# Patient Record
Sex: Male | Born: 1985 | Race: White | Hispanic: No | Marital: Married | Smoking: Never smoker
Health system: Southern US, Community
[De-identification: ages and names within clinical notes are randomized; demographics above are authoritative.]

## PROBLEM LIST (undated history)

## (undated) DIAGNOSIS — R109 Unspecified abdominal pain: Secondary | ICD-10-CM

## (undated) DIAGNOSIS — J45909 Unspecified asthma, uncomplicated: Secondary | ICD-10-CM

## (undated) DIAGNOSIS — M545 Low back pain, unspecified: Secondary | ICD-10-CM

## (undated) HISTORY — DX: Low back pain: M54.5

## (undated) HISTORY — DX: Unspecified abdominal pain: R10.9

## (undated) HISTORY — DX: Low back pain, unspecified: M54.50

## (undated) HISTORY — DX: Unspecified asthma, uncomplicated: J45.909

---

## 2006-04-03 ENCOUNTER — Emergency Department (HOSPITAL_COMMUNITY): Admission: EM | Admit: 2006-04-03 | Discharge: 2006-04-04 | Payer: Self-pay | Admitting: Emergency Medicine

## 2006-04-12 ENCOUNTER — Ambulatory Visit: Payer: Self-pay | Admitting: Internal Medicine

## 2006-04-15 ENCOUNTER — Ambulatory Visit: Payer: Self-pay | Admitting: Internal Medicine

## 2006-04-15 LAB — CONVERTED CEMR LAB
HCV Ab: NEGATIVE
Hemoglobin: 15.6 g/dL (ref 13.0–17.0)
Hep B S Ab: NEGATIVE
Hepatitis B Surface Ag: NEGATIVE
Lymphocytes Relative: 31.3 % (ref 12.0–46.0)
Mono Screen: NEGATIVE
Monocytes Relative: 10.1 % (ref 3.0–11.0)
Platelets: 262 10*3/uL (ref 150–400)
RBC: 5.08 M/uL (ref 4.22–5.81)
Sed Rate: 6 mm/hr (ref 0–20)
TSH: 1.85 microintl units/mL (ref 0.35–5.50)
Total CK: 131 units/L (ref 7–195)
Vitamin B-12: 396 pg/mL (ref 211–911)
WBC: 4.2 10*3/uL — ABNORMAL LOW (ref 4.5–10.5)

## 2006-04-19 ENCOUNTER — Ambulatory Visit: Payer: Self-pay | Admitting: Internal Medicine

## 2006-04-30 ENCOUNTER — Ambulatory Visit: Payer: Self-pay | Admitting: Internal Medicine

## 2006-05-02 ENCOUNTER — Ambulatory Visit: Payer: Self-pay

## 2006-05-02 ENCOUNTER — Encounter: Payer: Self-pay | Admitting: Cardiology

## 2006-06-06 ENCOUNTER — Ambulatory Visit: Payer: Self-pay | Admitting: Internal Medicine

## 2006-07-04 ENCOUNTER — Ambulatory Visit: Payer: Self-pay | Admitting: Cardiology

## 2006-07-10 ENCOUNTER — Ambulatory Visit: Payer: Self-pay | Admitting: Internal Medicine

## 2006-09-20 DIAGNOSIS — J45909 Unspecified asthma, uncomplicated: Secondary | ICD-10-CM | POA: Insufficient documentation

## 2007-10-24 ENCOUNTER — Ambulatory Visit: Payer: Self-pay | Admitting: Internal Medicine

## 2007-10-24 DIAGNOSIS — R109 Unspecified abdominal pain: Secondary | ICD-10-CM | POA: Insufficient documentation

## 2007-10-24 DIAGNOSIS — M545 Low back pain, unspecified: Secondary | ICD-10-CM | POA: Insufficient documentation

## 2007-10-27 LAB — CONVERTED CEMR LAB
ALT: 11 units/L (ref 0–53)
Basophils Absolute: 0 10*3/uL (ref 0.0–0.1)
Basophils Relative: 0.5 % (ref 0.0–3.0)
Bilirubin Urine: NEGATIVE
Eosinophils Relative: 2.2 % (ref 0.0–5.0)
Hemoglobin, Urine: NEGATIVE
Hemoglobin: 14.6 g/dL (ref 13.0–17.0)
Lymphocytes Relative: 24.2 % (ref 12.0–46.0)
Monocytes Absolute: 0.8 10*3/uL (ref 0.1–1.0)
Monocytes Relative: 11.6 % (ref 3.0–12.0)
Neutrophils Relative %: 61.5 % (ref 43.0–77.0)
Platelets: 267 10*3/uL (ref 150–400)
RBC: 4.78 M/uL (ref 4.22–5.81)
RDW: 12.5 % (ref 11.5–14.6)
Sed Rate: 6 mm/hr (ref 0–16)
Urine Glucose: NEGATIVE mg/dL
WBC: 6.8 10*3/uL (ref 4.5–10.5)
pH: 8 (ref 5.0–8.0)

## 2008-03-17 IMAGING — CR DG CHEST 2V
2 series · 2 of 2 positions shown · non-contrast
Comparison: none

CLINICAL DATA: 20-year-old male, chest pain.  Tachycardia. 
 CHEST - 2 VIEW:

[view not recorded (1 of 2)]
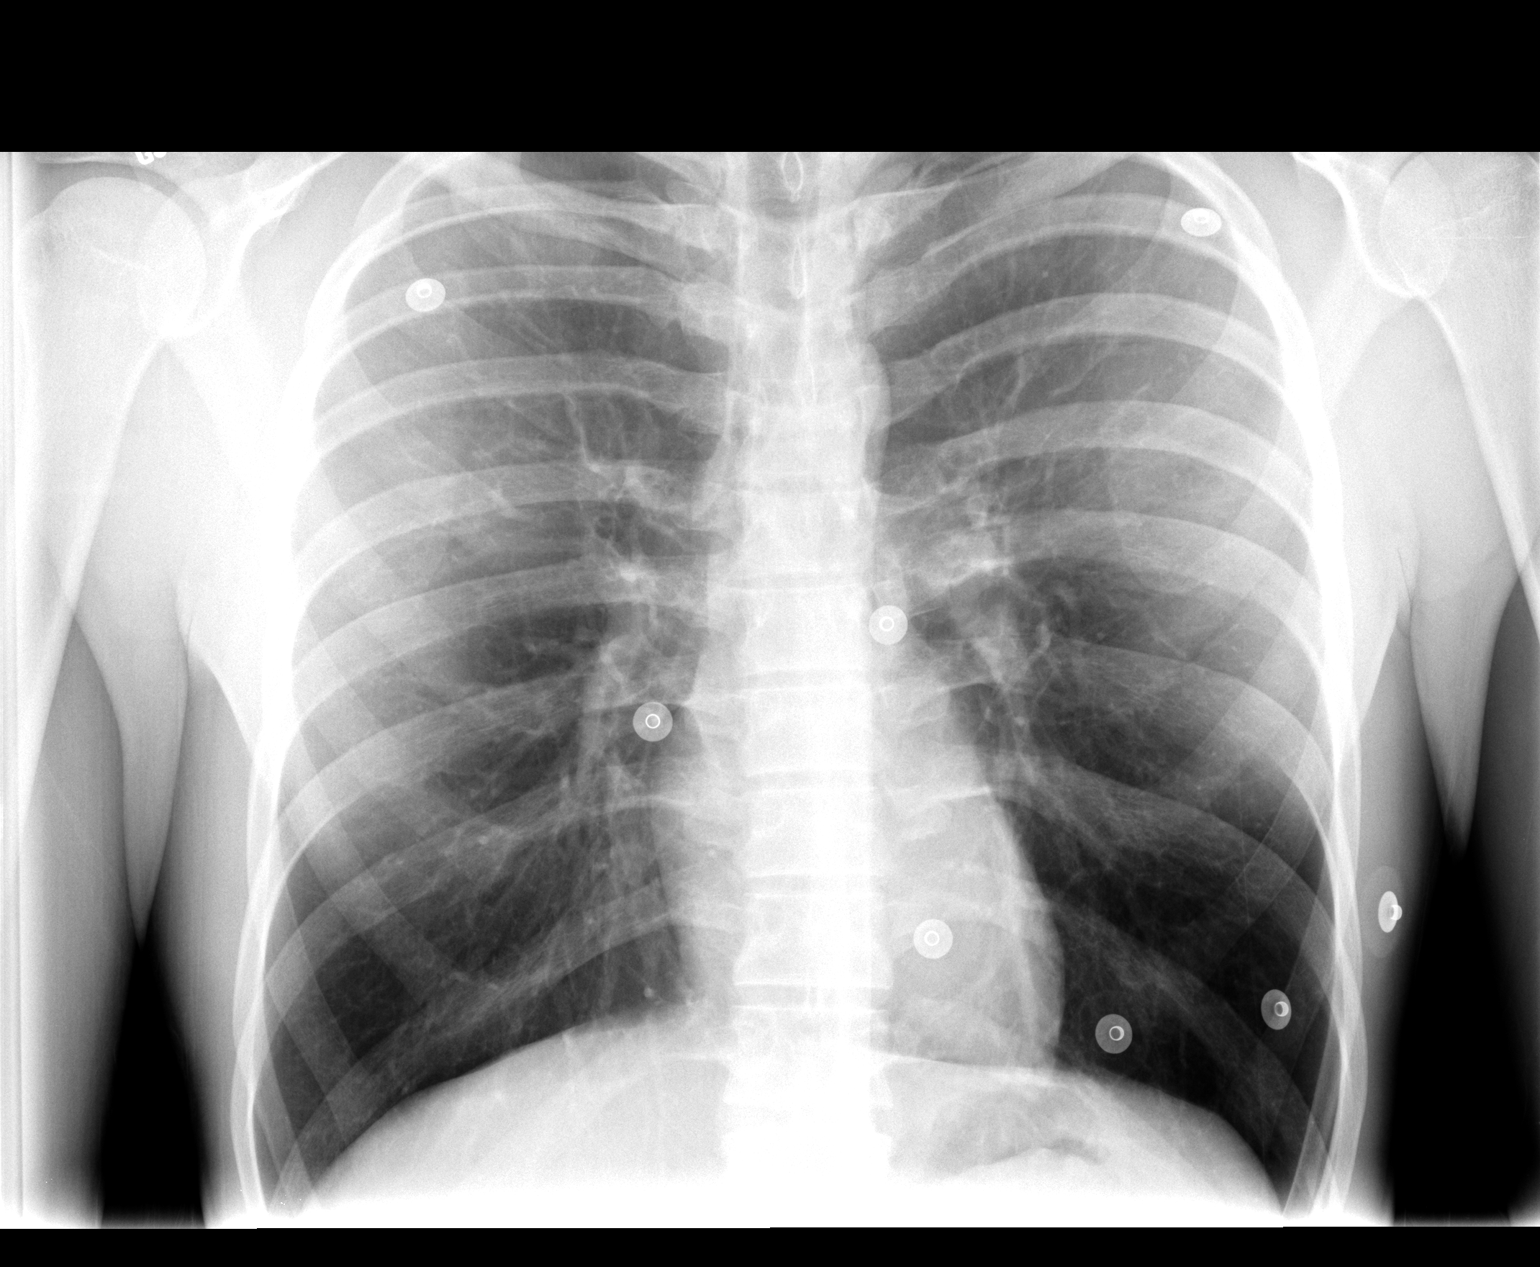

[view not recorded (2 of 2)]
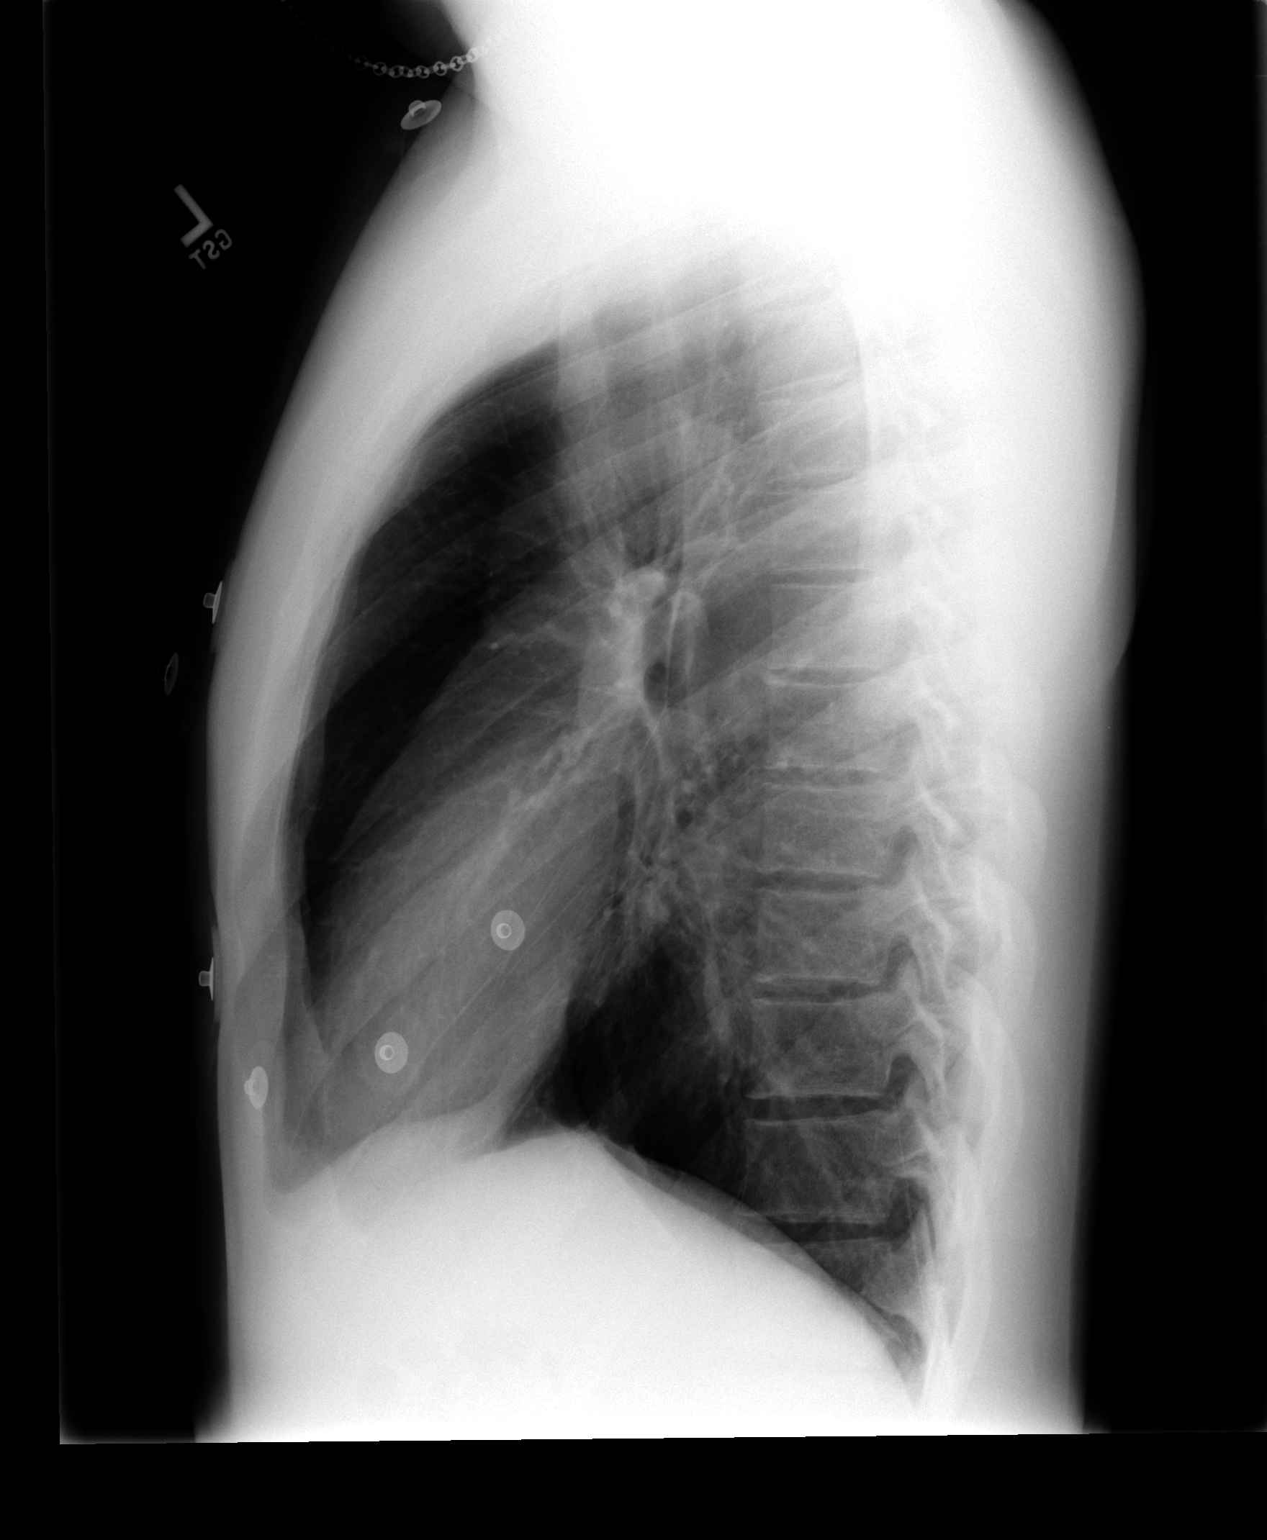

[2 of 2 positions shown; findings below may reference images not displayed]

FINDINGS: Heart size is within normal limits.  The lungs are clear.  The visualized soft tissues and bony thorax are unremarkable.
IMPRESSION: No acute cardiopulmonary disease.

## 2010-03-19 ENCOUNTER — Encounter: Payer: Self-pay | Admitting: Internal Medicine

## 2010-07-14 NOTE — Assessment & Plan Note (Signed)
[facility]                           PRIMARY CARE OFFICE NOTE   Raymond Larson, Raymond Larson Raymond Larson                 MRN:          [id number]  DATE:04/12/2006                            DOB:          1985/09/24    The patient is a 25 year old new patient who presents with complaint of  being tired of 2-3 weeks duration who has occasional palpitations,  shortness of breath, lightheadedness.  On February 6 his left leg felt  numb, tingling, and he went to the emergency room where he was evaluated  with EKG, chest x-ray, blood work, and discharged home.  He continues to  stay weak and tired, no respiratory symptoms, no GI, urinary, or other  complaints.   PAST MEDICAL HISTORY:  Asthma as a child.   FAMILY HISTORY:  Negative for musculoskeletal disease, myopathies, etc.   SOCIAL HISTORY:  Both parents are healthy.  He is a Consulting civil engineer at [employer], also works almost full-time at the [employer], sport and exercise at  [employer].  He lives with parents, he works out, does not smoke, no  alcohol, denies drugs.   REVIEW OF SYSTEMS:  No chest pain or shortness of breath at rest, just  exhausted when exerting himself.  No syncope and no chest pain, double  vision.  The rest of the 18 point review of systems is negative.   PHYSICAL EXAMINATION:  Blood pressure 135/75, pulse 80, temperature  98.7, weight 150 pounds.  He looks well, he is in no acute distress.  HEENT:  With moist mucosa, no erythema.  NECK:  Supple, no thyromegaly, no bruit.  LUNGS:  Clear, no wheeze or rales.  HEART:  S1, S2, no gallop, probable grade 1-2 over 6 systolic murmur.  ABDOMEN:  Soft, nontender, no organomegaly, no mass.  LOWER EXTREMITIES:  Without edema.  He is alert and cooperative, denies being depressed.  SKIN:  Clear, no rashes.  No lymphadenopathy.  Bones nontender.  Pupils reactive.   Labs on April 03, 2005:  Hemoglobin 15, potassium 3.7, glucose 97, CK  less than 1.0, myoglobin  39, mono test negative.  Chest x-ray normal.   ASSESSMENT/PLAN:  Fatigue for 3 weeks duration, unclear etiology.  Most  likely viral/post viral syndrome.  I will repeat complete blood count  and differential, mono test, hepatitis test, liver  test, human immunodeficiency virus, sed rate, CPK, vitamin B-12 test on  Monday if not  improved.  Will schedule an echocardiogram in the view of his dyspnea.  I will see him back in a couple of weeks, re: all his problems.     Raymond Quint. Plotnikov, MD  Electronically Signed    AVP/MedQ  DD: 04/18/2006  DT: 04/18/2006  Job #: [id number]

## 2013-03-20 ENCOUNTER — Telehealth: Payer: Self-pay | Admitting: Emergency Medicine

## 2013-03-20 ENCOUNTER — Emergency Department (INDEPENDENT_AMBULATORY_CARE_PROVIDER_SITE_OTHER): Payer: 59

## 2013-03-20 ENCOUNTER — Encounter: Payer: Self-pay | Admitting: Emergency Medicine

## 2013-03-20 ENCOUNTER — Emergency Department
Admission: EM | Admit: 2013-03-20 | Discharge: 2013-03-20 | Disposition: A | Discharge status: Home / Self Care | Payer: 59 | Source: Home / Self Care | Attending: Family Medicine | Admitting: Family Medicine

## 2013-03-20 DIAGNOSIS — R5383 Other fatigue: Principal | ICD-10-CM

## 2013-03-20 DIAGNOSIS — R071 Chest pain on breathing: Secondary | ICD-10-CM

## 2013-03-20 DIAGNOSIS — R5381 Other malaise: Secondary | ICD-10-CM

## 2013-03-20 LAB — POCT CBC W AUTO DIFF (K'VILLE URGENT CARE)

## 2013-03-20 NOTE — Discharge Instructions (Signed)
Fatigue °Fatigue is a feeling of tiredness, lack of energy, lack of motivation, or feeling tired all the time. Having enough rest, good nutrition, and reducing stress will normally reduce fatigue. Consult your caregiver if it persists. The nature of your fatigue will help your caregiver to find out its cause. The treatment is based on the cause.  °CAUSES  °There are many causes for fatigue. Most of the time, fatigue can be traced to one or more of your habits or routines. Most causes fit into one or more of three general areas. They are: °Lifestyle problems °· Sleep disturbances. °· Overwork. °· Physical exertion. °· Unhealthy habits. °· Poor eating habits or eating disorders. °· Alcohol and/or drug use . °· Lack of proper nutrition (malnutrition). °Psychological problems °· Stress and/or anxiety problems. °· Depression. °· Grief. °· Boredom. °Medical Problems or Conditions °· Anemia. °· Pregnancy. °· Thyroid gland problems. °· Recovery from major surgery. °· Continuous pain. °· Emphysema or asthma that is not well controlled °· Allergic conditions. °· Diabetes. °· Infections (such as mononucleosis). °· Obesity. °· Sleep disorders, such as sleep apnea. °· Heart failure or other heart-related problems. °· Cancer. °· Kidney disease. °· Liver disease. °· Effects of certain medicines such as antihistamines, cough and cold remedies, prescription pain medicines, heart and blood pressure medicines, drugs used for treatment of cancer, and some antidepressants. °SYMPTOMS  °The symptoms of fatigue include:  °· Lack of energy. °· Lack of drive (motivation). °· Drowsiness. °· Feeling of indifference to the surroundings. °DIAGNOSIS  °The details of how you feel help guide your caregiver in finding out what is causing the fatigue. You will be asked about your present and past health condition. It is important to review all medicines that you take, including prescription and non-prescription items. A thorough exam will be done.  You will be questioned about your feelings, habits, and normal lifestyle. Your caregiver may suggest blood tests, urine tests, or other tests to look for common medical causes of fatigue.  °TREATMENT  °Fatigue is treated by correcting the underlying cause. For example, if you have continuous pain or depression, treating these causes will improve how you feel. Similarly, adjusting the dose of certain medicines will help in reducing fatigue.  °HOME CARE INSTRUCTIONS  °· Try to get the required amount of good sleep every night. °· Eat a healthy and nutritious diet, and drink enough water throughout the day. °· Practice ways of relaxing (including yoga or meditation). °· Exercise regularly. °· Make plans to change situations that cause stress. Act on those plans so that stresses decrease over time. Keep your work and personal routine reasonable. °· Avoid street drugs and minimize use of alcohol. °· Start taking a daily multivitamin after consulting your caregiver. °SEEK MEDICAL CARE IF:  °· You have persistent tiredness, which cannot be accounted for. °· You have fever. °· You have unintentional weight loss. °· You have headaches. °· You have disturbed sleep throughout the night. °· You are feeling sad. °· You have constipation. °· You have dry skin. °· You have gained weight. °· You are taking any new or different medicines that you suspect are causing fatigue. °· You are unable to sleep at night. °· You develop any unusual swelling of your legs or other parts of your body. °SEEK IMMEDIATE MEDICAL CARE IF:  °· You are feeling confused. °· Your vision is blurred. °· You feel faint or pass out. °· You develop severe headache. °· You develop severe abdominal, pelvic, or   back pain.  You develop chest pain, shortness of breath, or an irregular or fast heartbeat.  You are unable to pass a normal amount of urine.  You develop abnormal bleeding such as bleeding from the rectum or you vomit blood.  You have thoughts  about harming yourself or committing suicide.  You are worried that you might harm someone else. MAKE SURE YOU:   Understand these instructions.  Will watch your condition.  Will get help right away if you are not doing well or get worse. Document Released: 12/10/2006 Document Revised: 05/07/2011 Document Reviewed: 12/10/2006 [facility] Patient Information 2014 [employer], [place].   Cardiac Event Monitoring A cardiac event monitor is a small recording device used to help detect abnormal heart rhythms (arrhythmias). The monitor is used to record heart rhythm when noticeable symptoms such as the following occur:  Fast heart beats (palpitations), such as heart racing or fluttering.  Dizziness.  Fainting or lightheadedness.  Unexplained weakness. The monitor is wired to two electrodes placed on your chest. Electrodes are flat, sticky disks that attach to your skin. The monitor can be worn for up to 30 days. You will wear the monitor at all times, except when bathing.  HOW TO USE YOUR CARDIAC EVENT MONITOR A technician will prepare your chest for the electrode placement. The technician will show you how to place the electrodes, how to work the monitor, and how to replace the batteries. Take time to practice using the monitor before you leave the office. Make sure you understand how to send the information from the monitor to your health care provider. This requires a telephone with a landline, not a cellphone. You need to:  Wear your monitor at all times, except when you are in water:  Do not get the monitor wet.  Take the monitor off when bathing. Do not swim or use a hot tub with it on.  Keep your skin clean. Do not put body lotion or moisturizer on your chest.  Change the electrodes daily or any time they stop sticking to your skin. You might need to use tape to keep them on.  It is possible that your skin under the electrodes could become irritated. To keep this from happening, try to  put the electrodes in slightly different places on your chest. However, they must remain in the area under your left breast and in the upper right section of your chest.  Make sure the monitor is safely clipped to your clothing or in a location close to your body that your health care provider recommends.  Press the button to record when you feel symptoms of heart trouble, such as dizziness, weakness, lightheadedness, palpitations, thumping, shortness of breath, unexplained weakness, or a fluttering or racing heart. The monitor is always on and records what happened slightly before you pressed the button, so do not worry about being too late to get good information.  Keep a diary of your activities, such as walking, doing chores, and taking medicine. It is especially important to note what you were doing when you pushed the button to record your symptoms. This will help your health care provider determine what might be contributing to your symptoms. The information stored in your monitor will be reviewed by your health care provider alongside your diary entries.  Send the recorded information as recommended by your health care provider. It is important to understand that it will take some time for your health care provider to process the results.  Change the  batteries as recommended by your health care provider. SEEK IMMEDIATE MEDICAL CARE IF:   You have chest pain.  You have extreme difficulty breathing or shortness of breath.  You develop a very fast heartbeat that persists.  You develop dizziness that does not go away .  You faint or constantly feel you are about to faint. Document Released: 11/22/2007 Document Revised: 10/15/2012 Document Reviewed: 08/11/2012 [facility] Patient Information 2014 [place], [place].

## 2013-03-20 NOTE — ED Notes (Signed)
Right neck pain mostly when exercising for about 2 months, worse the last month. Very fatigued after working out and at the end of the day, occasional body aches. Denies fever, denies new sex partners, has girlfriend for 2 years

## 2013-03-20 NOTE — ED Provider Notes (Signed)
CSN: [id number]     Arrival date & time 03/20/13  0818 History   First MD Initiated Contact with Patient 03/20/13 [phone]     Chief Complaint  Patient presents with  . Neck Pain      HPI Comments: Patient complains of a one month history of a vague pain in his right neck, radiating somewhat into the right shoulder, that occurs only when he exercises.  He denies pain or weakness in his right arm. He states that whenever he participates in activities that accelerate his heart rate, such as playing basketball, he feels a pressure-like sensation in his right neck, and feels that his heart rate is too rapid.  He also complains of excessive fatigue at the end of each day especially after working out.  He states that he has always been regularly physically active.  He denies specific pain in his chest.  He states that he feels more winded during exercise than he should be.  He denies lightheadedness or dizziness.  No GI or GU symptoms.   He states that he had similar symptoms about 4 years ago and visited his family doctor for evaluation.  At that time he had a normal EKG, echocardiogram, and chest X-ray.  His symptoms gradually subsided, and have now recurred. Past history negative Family history negative for CV disease.  The history is provided by the patient.    History reviewed. No pertinent past medical history. History reviewed. No pertinent past surgical history. No family history on file. History  Substance Use Topics  . Smoking status: Never Smoker   . Smokeless tobacco: Not on file  . Alcohol Use: No    Review of Systems  Constitutional: Positive for fatigue. Negative for fever, chills, diaphoresis, activity change, appetite change and unexpected weight change.  HENT: Negative for congestion, ear pain, hearing loss, sore throat and trouble swallowing.   Eyes: Negative.   Respiratory: Positive for chest tightness and shortness of breath. Negative for cough, choking and wheezing.    Cardiovascular: Positive for palpitations. Negative for chest pain and leg swelling.  Gastrointestinal: Negative for nausea, vomiting, abdominal pain, diarrhea and constipation.  Endocrine: Negative.   Genitourinary: Negative.   Musculoskeletal: Positive for neck pain. Negative for back pain, joint swelling, myalgias and neck stiffness.  Skin: Negative.   Neurological: Negative.   Hematological: Negative.     Allergies  Review of patient's allergies indicates no known allergies.  Home Medications  No current outpatient prescriptions on file. BP 146/80  Pulse 89  Temp(Src) 97.7 F (36.5 C) (Oral)  Ht 5\' 10"  (1.778 m)  Wt 156 lb (70.761 kg)  BMI 22.38 kg/m2  SpO2 99% Physical Exam Nursing notes and Vital Signs reviewed. Appearance:  Patient appears healthy, stated age, and in no acute distress Eyes:  Pupils are equal, round, and reactive to light and accomodation.  Extraocular movement is intact.  Conjunctivae are not inflamed  Ears:  Canals normal.  Tympanic membranes normal.  Nose:  Normal turbinates.  No sinus tenderness.   Mouth:  Normal; no lesions Pharynx:  Normal Neck:  Supple.  No adenopathy or thyromegaly.  Carotids have normal upstrokes without bruits.  No cervical tenderness to palpation. Lungs:  Clear to auscultation.  Breath sounds are equal.  Heart:  Regular rate and rhythm without murmurs, rubs, or gallops.  Abdomen:  Nontender without masses or hepatosplenomegaly.  Bowel sounds are present.  No CVA or flank tenderness.  Extremities:  No edema.   Skin:  No  rash present.   ED Course  Procedures  none    Labs Reviewed  COMPLETE METABOLIC PANEL WITH GFR  TSH  POCT CBC W AUTO DIFF ([facility]) WBC 4.5; LY 37.6; MO; GR; Hgb; Platelets     Imaging Review Dg Chest 2 View  03/20/2013   CLINICAL DATA:  Shortness of Breath  EXAM: CHEST  2 VIEW  COMPARISON:  Chest radiograph April 04, 2006 and chest CT Jul 04, 2006  FINDINGS: Lungs are clear. Heart size  and pulmonary vascularity are normal. No adenopathy. No bone lesions.  IMPRESSION: No abnormality noted.   Electronically Signed   By: Bretta Bang M.D.   On: 03/20/2013 09:22    EKG Interpretation    Date/Time:  03/20/2013    Ventricular Rate:  57   PR Interval:  0.126   QRS Duration:  0.94  QT Interval:  0.390    QTC Calculation:  0.385   R Axis:   75 degrees                                                                                                                                                                                                                                                                                                                                                                                                                                                     Text Interpretation:  Sinus bradycardia; otherwise within normal limits                                    MDM   1. Other malaise and fatigue; symptoms most pronounced during exercise.  Normal chest X-ray, EKG, and CBC reassuring.  Rule out cardiac etiology   CMP and TSH pending. Will refer to cardiology:  ?stress test, ?event monitor    Lattie Haw, MD 03/20/13 1257

## 2013-03-21 LAB — COMPLETE METABOLIC PANEL WITH GFR
ALBUMIN: 4.7 g/dL (ref 3.5–5.2)
ALK PHOS: 33 U/L — AB (ref 39–117)
ALT: 9 U/L (ref 0–53)
AST: 12 U/L (ref 0–37)
BILIRUBIN TOTAL: 0.6 mg/dL (ref 0.3–1.2)
BUN: 16 mg/dL (ref 6–23)
CALCIUM: 9.9 mg/dL (ref 8.4–10.5)
CHLORIDE: 103 meq/L (ref 96–112)
CO2: 26 meq/L (ref 19–32)
CREATININE: 1.08 mg/dL (ref 0.50–1.35)
GFR, Est Non African American: >89 mL/min
GLUCOSE: 83 mg/dL (ref 70–99)
Potassium: 4.4 mEq/L (ref 3.5–5.3)
Sodium: 140 mEq/L (ref 135–145)
TOTAL PROTEIN: 7.2 g/dL (ref 6.0–8.3)

## 2013-03-21 LAB — TSH: TSH: 2.042 u[IU]/mL (ref 0.350–4.500)

## 2013-03-24 ENCOUNTER — Telehealth: Payer: Self-pay | Admitting: *Deleted

## 2013-04-13 ENCOUNTER — Encounter: Payer: Self-pay | Admitting: Cardiology

## 2013-04-13 ENCOUNTER — Encounter: Payer: Self-pay | Admitting: *Deleted

## 2013-04-15 ENCOUNTER — Encounter: Payer: 59 | Admitting: Cardiology

## 2013-04-15 NOTE — Progress Notes (Signed)
     HPI: 28 yo male for evaluation of palpiations. Echo 3/8 showed normal LV function. Chest xray, hgb, potassium and TSH normal in Jan 2015  No current outpatient prescriptions on file.   No current facility-administered medications for this visit.    No Known Allergies  Past Medical History  Diagnosis Date  . ABDOMINAL PAIN   . ASTHMA   . LOW BACK PAIN     No past surgical history on file.  History   Social History  . Marital Status: Single    Spouse Name: N/A    Number of Children: N/A  . Years of Education: N/A   Occupational History  . Not on file.   Social History Main Topics  . Smoking status: Never Smoker   . Smokeless tobacco: Not on file  . Alcohol Use: No  . Drug Use: Not on file  . Sexual Activity: Not on file   Other Topics Concern  . Not on file   Social History Narrative  . No narrative on file    No family history on file.  ROS: no fevers or chills, productive cough, hemoptysis, dysphasia, odynophagia, melena, hematochezia, dysuria, hematuria, rash, seizure activity, orthopnea, PND, pedal edema, claudication. Remaining systems are negative.  Physical Exam:   There were no vitals taken for this visit.  General:  Well developed/well nourished in NAD Skin warm/dry Patient not depressed No peripheral clubbing Back-normal HEENT-normal/normal eyelids Neck supple/normal carotid upstroke bilaterally; no bruits; no JVD; no thyromegaly chest - CTA/ normal expansion CV - RRR/normal S1 and S2; no murmurs, rubs or gallops;  PMI nondisplaced Abdomen -NT/ND, no HSM, no mass, + bowel sounds, no bruit 2+ femoral pulses, no bruits Ext-no edema, chords, 2+ DP Neuro-grossly nonfocal  ECG    This encounter was created in error - please disregard.

## 2018-02-20 ENCOUNTER — Other Ambulatory Visit: Payer: Self-pay

## 2018-02-20 ENCOUNTER — Emergency Department (INDEPENDENT_AMBULATORY_CARE_PROVIDER_SITE_OTHER): Payer: BLUE CROSS/BLUE SHIELD

## 2018-02-20 ENCOUNTER — Emergency Department (INDEPENDENT_AMBULATORY_CARE_PROVIDER_SITE_OTHER)
Admission: EM | Admit: 2018-02-20 | Discharge: 2018-02-20 | Disposition: A | Discharge status: Home / Self Care | Payer: BLUE CROSS/BLUE SHIELD | Source: Home / Self Care | Attending: Family Medicine | Admitting: Family Medicine

## 2018-02-20 ENCOUNTER — Encounter: Payer: Self-pay | Admitting: *Deleted

## 2018-02-20 DIAGNOSIS — R0789 Other chest pain: Secondary | ICD-10-CM | POA: Diagnosis not present

## 2018-02-20 DIAGNOSIS — M25511 Pain in right shoulder: Secondary | ICD-10-CM

## 2018-02-20 DIAGNOSIS — M940 Chondrocostal junction syndrome [Tietze]: Secondary | ICD-10-CM | POA: Diagnosis not present

## 2018-02-20 DIAGNOSIS — M542 Cervicalgia: Secondary | ICD-10-CM | POA: Diagnosis not present

## 2018-02-20 NOTE — ED Triage Notes (Signed)
Pt c/o RT sided CP that radiates to his RT neck and shoulder x 2 wks intermittently, worse x 1 day.

## 2018-02-20 NOTE — ED Provider Notes (Signed)
[facility]    CSN: [id number] Arrival date & time: 02/20/18  0809     History   Chief Complaint Chief Complaint  Patient presents with  . Chest Pain    HPI Raymond Larson is a 32 y.o. male.   Patient complains of 2 week history of recurring vague right sided chest pain that radiates to his right neck and shoulder.  Last night while carrying his infant downstairs, he had recurrent pain in his right chest that lasted about 15 minutes.  He reports that sometimes when he is physically active he will have aching in his right shoulder and neck.  He has occasional brief positional tingling/numbness in his arms.  He reports that he fatigues easily. Review of chart records reveals that patient was evaluated on 03/20/13 for vague pain in his right neck and shoulder that occurred when he exercised.  He had a normal chest x-ray, EKG, and CBC.  He was referred to a cardiologist for further evaluation but did not go. Patient had similar symptoms approximately 2011.  At that time he had a normal EKG, echocardiogram, and chest X-ray.  The history is provided by the patient.  Chest Pain  Pain location:  R chest Pain quality: aching   Pain radiates to:  Neck and R shoulder Pain severity:  Mild Onset quality:  Sudden Duration:  2 weeks Timing:  Intermittent Progression:  Unchanged Chronicity:  Recurrent Context: movement   Relieved by:  Rest Exacerbated by: carrying his infant. Ineffective treatments:  None tried Associated symptoms: anxiety   Associated symptoms: no abdominal pain, no AICD problem, no anorexia, no back pain, no cough, no diaphoresis, no dizziness, no dysphagia, no fever, no headache, no heartburn, no lower extremity edema, no nausea, no near-syncope, no numbness, no palpitations, no PND, no shortness of breath and no syncope     Past Medical History:  Diagnosis Date  . ABDOMINAL PAIN   . ASTHMA   . LOW BACK PAIN     Patient Active Problem List   Diagnosis Date Noted  . LOW BACK PAIN 10/24/2007  . ABDOMINAL PAIN 10/24/2007  . ASTHMA 09/20/2006    History reviewed. No surgical history.     Home Medications    Prior to Admission medications   None    Family History Family History  Problem Relation Age of Onset  . Cancer Father        colon    Social History Social History   Tobacco Use  . Smoking status: Never Smoker  . Smokeless tobacco: Never Used  Substance Use Topics  . Alcohol use: No  . Drug use: Never     Allergies   Patient has no known allergies.   Review of Systems Review of Systems  Constitutional: Negative for diaphoresis and fever.  HENT: Negative for trouble swallowing.   Respiratory: Negative for cough and shortness of breath.   Cardiovascular: Positive for chest pain. Negative for palpitations, syncope, PND and near-syncope.  Gastrointestinal: Negative for abdominal pain, anorexia, heartburn and nausea.  Musculoskeletal: Negative for back pain.  Neurological: Negative for dizziness, numbness and headaches.  All other systems reviewed and are negative.    Physical Exam Triage Vital Signs ED Triage Vitals  Enc Vitals Group     BP 02/20/18 0830 129/90     Pulse Rate 02/20/18 0830 (!) 105     Resp 02/20/18 0830 18     Temp 02/20/18 0830 97.8 F (36.6 C)  Temp Source 02/20/18 0830 Oral     SpO2 02/20/18 0830 98 %     Weight 02/20/18 0831 160 lb (72.6 kg)     Height 02/20/18 0831 5\' 10"  (1.778 m)     Head Circumference --      Peak Flow --      Pain Score 02/20/18 0831 3     Pain Loc --      Pain Edu? --      Excl. in GC? --    No data found.  Updated Vital Signs BP 129/90 (BP Location: Right Arm)   Pulse (!) 105   Temp 97.8 F (36.6 C) (Oral)   Resp 18   Ht 5\' 10"  (1.778 m)   Wt 72.6 kg   SpO2 98%   BMI 22.96 kg/m   Visual Acuity Right Eye Distance:   Left Eye Distance:   Bilateral Distance:    Right Eye Near:   Left Eye Near:    Bilateral Near:      Physical Exam Vitals signs and nursing note reviewed.  Constitutional:      General: He is not in acute distress.    Appearance: He is well-developed. He is not ill-appearing.  HENT:     Head: Normocephalic.     Right Ear: External ear normal.     Left Ear: External ear normal.     Nose: Nose normal.     Mouth/Throat:     Mouth: Mucous membranes are moist.     Pharynx: Oropharynx is clear.  Eyes:     Extraocular Movements: Extraocular movements intact.     Pupils: Pupils are equal, round, and reactive to light.  Neck:     Musculoskeletal: Normal range of motion and neck supple.  Cardiovascular:     Rate and Rhythm: Normal rate.     Heart sounds: Normal heart sounds.  Pulmonary:     Breath sounds: Normal breath sounds.  Chest:     Chest wall: Tenderness present.       Comments: Chest:  Distinct tenderness to palpation over the mid-sternum as noted on diagram.  Abdominal:     General: Bowel sounds are normal.     Tenderness: There is no abdominal tenderness.  Musculoskeletal:     Right lower leg: No edema.  Lymphadenopathy:     Cervical: No cervical adenopathy.  Skin:    General: Skin is warm and dry.     Findings: No rash.  Neurological:     Mental Status: He is alert.      UC Treatments / Results  Labs (all labs ordered are listed, but only abnormal results are displayed) Labs Reviewed - No data to display  EKG  Rate:  91 BPM PR:  128 msec QT:  346 msec QTcH:  425 msec QRSD:  90 msec QRS axis:  785 degrees Interpretation: normal sinus rhythm; within normal limits   Radiology Dg Chest 2 View  Result Date: 02/20/2018 CLINICAL DATA:  Recurring right-sided chest pain with activity. EXAM: CHEST - 2 VIEW COMPARISON:  March 20, 2013 FINDINGS: The heart size and mediastinal contours are within normal limits. Both lungs are clear. The visualized skeletal structures are unremarkable. IMPRESSION: No active cardiopulmonary disease. Electronically Signed   By:  Gerome Sam III M.D   On: 02/20/2018 09:06   Dg Cervical Spine Complete  Result Date: 02/20/2018 CLINICAL DATA:  Recurring right-sided neck and shoulder pain for 2 weeks. No trauma. EXAM: CERVICAL SPINE - COMPLETE 4+  VIEW COMPARISON:  None. FINDINGS: There is no evidence of cervical spine fracture or prevertebral soft tissue swelling. Alignment is normal. No other significant bone abnormalities are identified. IMPRESSION: Negative cervical spine radiographs. Electronically Signed   By: Gerome Samavid  Williams III M.D   On: 02/20/2018 09:07    Procedures Procedures (including critical care time)  Medications Ordered in UC Medications - No data to display  Initial Impression / Assessment and Plan / UC Course  I have reviewed the triage vital signs and the nursing notes.  Pertinent labs & imaging results that were available during my care of the patient were reviewed by me and considered in my medical decision making (see chart for details).    Normal chest X-ray, EKG, and C-spine films reassuring. Recommend follow-up with PCP for complete annual physical exam to address patient's concerns.   Final Clinical Impressions(s) / UC Diagnoses   Final diagnoses:  Costochondritis     Discharge Instructions     May take Ibuprofen 200mg , 4 tabs every 8 hours with food.   Place ice on the painful area: ? Put ice in a plastic bag. ? Place a towel between your skin and the bag. ? Leave the ice on for 20 minutes, 2-3 times a day.    ED Prescriptions    None         Lattie HawBeese, Stephen A, MD 02/22/18 2236

## 2018-02-20 NOTE — Discharge Instructions (Signed)
May take Ibuprofen 200mg , 4 tabs every 8 hours with food.  Place ice on the painful area: Put ice in a plastic bag. Place a towel between your skin and the bag. Leave the ice on for 20 minutes, 2-3 times a day.

## 2019-04-22 ENCOUNTER — Encounter: Payer: Self-pay | Admitting: *Deleted

## 2019-04-22 ENCOUNTER — Emergency Department
Admission: EM | Admit: 2019-04-22 | Discharge: 2019-04-22 | Disposition: A | Discharge status: Home / Self Care | Payer: BC Managed Care – PPO | Source: Home / Self Care

## 2019-04-22 ENCOUNTER — Encounter: Payer: Self-pay | Admitting: Gastroenterology

## 2019-04-22 ENCOUNTER — Other Ambulatory Visit: Payer: Self-pay

## 2019-04-22 DIAGNOSIS — R109 Unspecified abdominal pain: Secondary | ICD-10-CM

## 2019-04-22 LAB — POCT URINALYSIS DIP (MANUAL ENTRY)
Bilirubin, UA: NEGATIVE
Glucose, UA: NEGATIVE mg/dL
Leukocytes, UA: NEGATIVE
Nitrite, UA: NEGATIVE
Protein Ur, POC: NEGATIVE mg/dL
Spec Grav, UA: 1.025 (ref 1.010–1.025)
Urobilinogen, UA: 1 E.U./dL
pH, UA: 7 (ref 5.0–8.0)

## 2019-04-22 MED ORDER — METRONIDAZOLE 500 MG PO TABS: 500.0000 mg | ORAL_TABLET | Freq: Three times a day (TID) | ORAL | 0 refills | Status: AC

## 2019-04-22 MED ORDER — CIPROFLOXACIN HCL 500 MG PO TABS: 500.0000 mg | ORAL_TABLET | Freq: Two times a day (BID) | ORAL | 0 refills | Status: AC

## 2019-04-22 MED ORDER — DICYCLOMINE HCL 20 MG PO TABS: 20.0000 mg | ORAL_TABLET | Freq: Four times a day (QID) | ORAL | 0 refills | Status: DC | PRN

## 2019-04-22 NOTE — Discharge Instructions (Addendum)
Take medications as prescribed  Drink plenty of fluids  Monitor symptoms closely  Go to the ED immediately if you begin to experience any of the symptoms we discussed today or you have any other concerns  Follow-up with GI - Call as soon as possible to schedule an appointment

## 2019-04-22 NOTE — ED Triage Notes (Signed)
Pt c/o general abd pain and flank pain x 1 wk. Denies nausea, vomiting, or diarrhea. Normal BM's. Reports stomach making lots of noises.

## 2019-04-22 NOTE — ED Provider Notes (Signed)
[facility]    CSN: [id number] Arrival date & time: 04/22/19  1224      History   Chief Complaint Chief Complaint  Patient presents with  . Abdominal Pain    HPI Ovide Dusek Wuebker is a 34 y.o. male.   Subjective:   Haroun Cotham Bryngelson is a 34 y.o. male who presents for evaluation of abdominal pain. Onset was 1 week ago. Symptoms have been waxing and waning since onset. The pain is described as aching, pressure-like, sharp, shooting and stabbing. The pain varies in severity and intensity. The pain is located in the RUQ with radiation to the back. Eating seems to aggravate the pain. Nothing seems to alleviate the pain. He also endorses increased increased flatus. He had some anorexia at the onset of symptoms but it has since resolved. Patient has been able to tolerate PO without difficulty. The patient denies any constipation, diarrhea, dysuria, fever, hematochezia, hematuria, melena, myalgias, weight loss, nausea, sweats or vomiting. Patient's father has history of colon cancer. He is married and sexually active with his wife. He denies any concerns for STDs.  The patient's history has been marked as reviewed and updated as appropriate. Allergies, current medications, past family history, past medical history, past social history, past surgical history and problem list       Past Medical History:  Diagnosis Date  . ABDOMINAL PAIN   . ASTHMA   . LOW BACK PAIN     Patient Active Problem List   Diagnosis Date Noted  . LOW BACK PAIN 10/24/2007  . ABDOMINAL PAIN 10/24/2007  . ASTHMA 09/20/2006    History reviewed. No pertinent surgical history.     Home Medications    Prior to Admission medications   Medication Sig Start Date End Date Taking? Authorizing Provider  ciprofloxacin (CIPRO) 500 MG tablet Take 1 tablet (500 mg total) by mouth 2 (two) times daily for 7 days. 04/22/19 04/29/19  Lurline Idol, FNP  dicyclomine (BENTYL) 20 MG tablet Take 1  tablet (20 mg total) by mouth 4 (four) times daily as needed (abdominal pain). 04/22/19   Lurline Idol, FNP  metroNIDAZOLE (FLAGYL) 500 MG tablet Take 1 tablet (500 mg total) by mouth 3 (three) times daily for 7 days. 04/22/19 04/29/19  Lurline Idol, FNP    Family History Family History  Problem Relation Age of Onset  . Cancer Father        colon    Social History Social History   Tobacco Use  . Smoking status: Never Smoker  . Smokeless tobacco: Never Used  Substance Use Topics  . Alcohol use: No  . Drug use: Never     Allergies   Patient has no known allergies.   Review of Systems Review of Systems  Constitutional: Negative for fever.  Gastrointestinal: Positive for abdominal pain. Negative for blood in stool, constipation, diarrhea, nausea, rectal pain and vomiting.  Genitourinary: Negative for discharge, dysuria, flank pain, frequency, hematuria, penile swelling, scrotal swelling and testicular pain.  Musculoskeletal: Positive for back pain. Negative for myalgias.  All other systems reviewed and are negative.    Physical Exam Triage Vital Signs ED Triage Vitals  Enc Vitals Group     BP 04/22/19 0955 (!) 146/80     Pulse Rate 04/22/19 0955 94     Resp 04/22/19 0955 18     Temp 04/22/19 0955 (!) 97.3 F (36.3 C)     Temp Source 04/22/19 0955 Oral     SpO2 04/22/19 0955  99 %     Weight 04/22/19 0956 160 lb (72.6 kg)     Height 04/22/19 0956 5\' 10"  (1.778 m)     Head Circumference --      Peak Flow --      Pain Score 04/22/19 0956 3     Pain Loc --      Pain Edu? --      Excl. in GC? --    No data found.  Updated Vital Signs BP (!) 146/80 (BP Location: Right Arm)   Pulse 94   Temp (!) 97.3 F (36.3 C) (Oral)   Resp 18   Ht 5\' 10"  (1.778 m)   Wt 160 lb (72.6 kg)   SpO2 99%   BMI 22.96 kg/m   Visual Acuity Right Eye Distance:   Left Eye Distance:   Bilateral Distance:    Right Eye Near:   Left Eye Near:    Bilateral Near:     Physical  Exam Vitals reviewed.  Constitutional:      General: He is not in acute distress.    Appearance: He is well-developed. He is not ill-appearing.  HENT:     Head: Normocephalic.  Abdominal:     General: Bowel sounds are normal. There is no distension.     Palpations: Abdomen is soft.     Tenderness: There is no abdominal tenderness. There is no right CVA tenderness or left CVA tenderness.  Skin:    General: Skin is warm and dry.  Neurological:     General: No focal deficit present.     Mental Status: He is alert.  Psychiatric:        Mood and Affect: Mood normal.        Behavior: Behavior normal.      UC Treatments / Results  Labs (all labs ordered are listed, but only abnormal results are displayed) Labs Reviewed  POCT URINALYSIS DIP (MANUAL ENTRY) - Abnormal; Notable for the following components:      Result Value   Ketones, POC UA small (15) (*)    Blood, UA trace-lysed (*)    All other components within normal limits  COMPLETE METABOLIC PANEL WITH GFR  LIPASE    EKG   Radiology No results found.  Procedures Procedures (including critical care time)  Medications Ordered in UC Medications - No data to display  Initial Impression / Assessment and Plan / UC Course  I have reviewed the triage vital signs and the nursing notes.  Pertinent labs & imaging results that were available during my care of the patient were reviewed by me and considered in my medical decision making (see chart for details).      34 yo male with no significant medical history presents with a one-week history of waxing and waning right upper quadrant abdominal pain with radiation to the lower back. No constipation, diarrhea, dysuria, fever, hematochezia, hematuria, melena, myalgias, weight loss, nausea, sweats or vomiting. He is tolerating PO without difficulty. Patient is AAOx3. Afebrile. VSS. Nontoxic appearing. Abdominal exam benign. UA negative for any signs of infection. Will try a  course of Cipro and Flagyl as well as bentyl PRN. Patient advised to follow-up with GI ASAP given his symptoms and family history of colon CA. Patient verbalizes understanding and agreeable to proposed plan of care.   Today's evaluation has revealed no signs of a dangerous process. Discussed diagnosis with patient and/or guardian. Patient and/or guardian aware of their diagnosis, possible red flag symptoms to watch  out for and need for close follow up. Patient and/or guardian understands verbal and written discharge instructions. Patient and/or guardian comfortable with plan and disposition.  Patient and/or guardian has a clear mental status at this time, good insight into illness (after discussion and teaching) and has clear judgment to make decisions regarding their care  This care was provided during an unprecedented National Emergency due to the Novel Coronavirus (COVID-19) pandemic. COVID-19 infections and transmission risks place heavy strains on healthcare resources.  As this pandemic evolves, our facility, providers, and staff strive to respond fluidly, to remain operational, and to provide care relative to available resources and information. Outcomes are unpredictable and treatments are without well-defined guidelines. Further, the impact of COVID-19 on all aspects of urgent care, including the impact to patients seeking care for reasons other than COVID-19, is unavoidable during this national emergency. At this time of the global pandemic, management of patients has significantly changed, even for non-COVID positive patients given high local and regional COVID volumes at this time requiring high healthcare system and resource utilization. The standard of care for management of both COVID suspected and non-COVID suspected patients continues to change rapidly at the local, regional, national, and global levels. This patient was worked up and treated to the best available but ever changing evidence and  resources available at this current time.   Documentation was completed with the aid of voice recognition software. Transcription may contain typographical errors.  Final Clinical Impressions(s) / UC Diagnoses   Final diagnoses:  Abdominal pain, unspecified abdominal location     Discharge Instructions     . Take medications as prescribed  . Drink plenty of fluids  . Monitor symptoms closely  . Go to the ED immediately if you begin to experience any of the symptoms we discussed today or you have any other concerns  . Follow-up with GI - Call as soon as possible to schedule an appointment     ED Prescriptions    Medication Sig Dispense Auth. Provider   metroNIDAZOLE (FLAGYL) 500 MG tablet Take 1 tablet (500 mg total) by mouth 3 (three) times daily for 7 days. 21 tablet Enrique Sack, FNP   ciprofloxacin (CIPRO) 500 MG tablet Take 1 tablet (500 mg total) by mouth 2 (two) times daily for 7 days. 14 tablet Enrique Sack, FNP   dicyclomine (BENTYL) 20 MG tablet Take 1 tablet (20 mg total) by mouth 4 (four) times daily as needed (abdominal pain). 20 tablet Enrique Sack, FNP     PDMP not reviewed this encounter.   Enrique Sack, George 04/22/19 1040

## 2019-04-23 LAB — COMPLETE METABOLIC PANEL WITH GFR
AG Ratio: 2 (calc) (ref 1.0–2.5)
ALT: 9 U/L (ref 9–46)
AST: 13 U/L (ref 10–40)
Albumin: 4.9 g/dL (ref 3.6–5.1)
Alkaline phosphatase (APISO): 31 U/L — ABNORMAL LOW (ref 36–130)
BUN: 18 mg/dL (ref 7–25)
CO2: 25 mmol/L (ref 20–32)
Calcium: 9.8 mg/dL (ref 8.6–10.3)
Chloride: 104 mmol/L (ref 98–110)
Creat: 1.11 mg/dL (ref 0.60–1.35)
GFR, Est African American: 101 mL/min/{1.73_m2} (ref >=60)
GFR, Est Non African American: 87 mL/min/{1.73_m2} (ref >=60)
Globulin: 2.4 g/dL (calc) (ref 1.9–3.7)
Glucose, Bld: 88 mg/dL (ref 65–99)
Potassium: 4.3 mmol/L (ref 3.5–5.3)
Sodium: 139 mmol/L (ref 135–146)
Total Bilirubin: 0.6 mg/dL (ref 0.2–1.2)
Total Protein: 7.3 g/dL (ref 6.1–8.1)

## 2019-04-23 LAB — LIPASE: Lipase: 18 U/L (ref 7–60)

## 2019-05-05 ENCOUNTER — Encounter: Payer: Self-pay | Admitting: Gastroenterology

## 2019-05-05 ENCOUNTER — Other Ambulatory Visit: Payer: Self-pay

## 2019-05-05 ENCOUNTER — Ambulatory Visit: Payer: BC Managed Care – PPO | Admitting: Gastroenterology

## 2019-05-05 VITALS — BP 116/64 | HR 83 | Temp 97.3°F | Ht 70.0 in | Wt 160.1 lb

## 2019-05-05 DIAGNOSIS — Z800 Family history of malignant neoplasm of digestive organs: Secondary | ICD-10-CM

## 2019-05-05 DIAGNOSIS — R1084 Generalized abdominal pain: Secondary | ICD-10-CM | POA: Diagnosis not present

## 2019-05-05 DIAGNOSIS — R143 Flatulence: Secondary | ICD-10-CM | POA: Diagnosis not present

## 2019-05-05 NOTE — Patient Instructions (Signed)
Your provider has requested that you go to the basement level for lab work at [address] [place] in [place] [address]. Press "B" on the elevator. The lab is located at the first door on the left as you exit the elevator.  Return to office as needed  It was a pleasure to see you today!  Vito Cirigliano, D.O.

## 2019-05-05 NOTE — Progress Notes (Signed)
Chief Complaint: Abdominal pain   Referring Provider:     Lurline Idol, FNP [facility])   HPI:    Raymond Larson is a 34 y.o. male referred to the [facility]ology Clinic for evaluation of abdominal pain.  Pain started in 03/2019, described as aching, pressure-like pain mostly in the RUQ with radiation to the back.  Worse with eating.  No alleviating factors.  Increased flatus and borborygmi.  No associated hematochezia, melena, nausea, vomiting.  Weight stable.  Was seen in Urgent Care on 04/22/2019 for the symptoms x1 week at that time.  Evaluation largely unremarkable to include UA, lipase, CMP.  Prescribed course of Cipro/Flagyl and Bentyl prn with referral to GI-did not fill or take any of these.  Sxs had improved, but recurred in the last few days, described as a generalized dull discomfort.  Symptoms are nonlocalizing and do not limit him from activities.  Increased flatus for the last month or so. No change in stools.   No recent imaging for review.  No previous EGD or colonoscopy.  Family history notable for father with colon cancer, diagnosed around age 70.    Past Medical History:  Diagnosis Date  . ABDOMINAL PAIN   . ASTHMA   . LOW BACK PAIN      History reviewed. No pertinent surgical history. Family History  Problem Relation Age of Onset  . Colon cancer Father        had to have colon removed per patient dx either 62 or 63  . Esophageal cancer Neg Hx    Social History   Tobacco Use  . Smoking status: Never Smoker  . Smokeless tobacco: Never Used  . Tobacco comment: dip in high school  Substance Use Topics  . Alcohol use: Yes    Comment: ocassionally   . Drug use: Never   Current Outpatient Medications  Medication Sig Dispense Refill  . Ascorbic Acid (VITAMIN C PO) Take 1 tablet by mouth as needed.    . Multiple Vitamins-Minerals (ZINC PO) Take 1 tablet by mouth as needed.    . dicyclomine (BENTYL) 20 MG tablet  Take 1 tablet (20 mg total) by mouth 4 (four) times daily as needed (abdominal pain). (Patient not taking: Reported on 05/05/2019) 20 tablet 0   No current facility-administered medications for this visit.   No Known Allergies   Review of Systems: All systems reviewed and negative except where noted in HPI.     Physical Exam:    Wt Readings from Last 3 Encounters:  05/05/19 160 lb 2 oz (72.6 kg)  04/22/19 160 lb (72.6 kg)  02/20/18 160 lb (72.6 kg)    BP 116/64   Pulse 83   Temp (!) 97.3 F (36.3 C)   Ht 5\' 10"  (1.778 m)   Wt 160 lb 2 oz (72.6 kg)   BMI 22.98 kg/m  Constitutional:  Pleasant, in no acute distress. Psychiatric: Normal mood and affect. Behavior is normal. EENT: Pupils normal.  Conjunctivae are normal. No scleral icterus. Neck supple. No cervical LAD. Cardiovascular: Normal rate, regular rhythm. No edema Pulmonary/chest: Effort normal and breath sounds normal. No wheezing, rales or rhonchi. Abdominal: Mild generalized discomfort but not pain.  No rebound or guarding.  No peritoneal signs.  Soft, nondistended.  Bowel sounds active throughout. There are no masses palpable. No hepatomegaly. Neurological: Alert and oriented to person place and time. Skin: Skin is warm and dry.  No rashes noted.   ASSESSMENT AND PLAN;   1) Generalized abdominal pain 2) Increased flatus  Relatively recent onset generalized, vague abdominal symptoms.  Overall symptoms have improved.  Discussed potential GI pathology.  Discussed GI symptoms with relation to his family history (father with CRC around age 80).  -Discussed the role and utility of colonoscopy now given family history, but opted for observation given clinical improvement -Should symptoms recur, worsen, or other concerns, to call the office or schedule colonoscopy -Check CBC -Given clinical improvement, no additional imaging at this time -Can implement low FODMAP diet, diet diary  3) Family history of CRC -In the  absence of return of symptoms, plan for colonoscopy at age 71 for early CRC screening  I spent 35 minutes of time, including in depth chart review, independent review of results as outlined above, communicating results with the patient directly, face-to-face time with the patient, coordinating care, ordering studies and medications as appropriate, and documentation.    [place], DO, FACG  05/05/2019, 2:51 PM   No ref. provider found

## 2019-05-12 ENCOUNTER — Other Ambulatory Visit (INDEPENDENT_AMBULATORY_CARE_PROVIDER_SITE_OTHER): Payer: BC Managed Care – PPO

## 2019-05-12 DIAGNOSIS — Z800 Family history of malignant neoplasm of digestive organs: Secondary | ICD-10-CM

## 2019-05-12 DIAGNOSIS — R143 Flatulence: Secondary | ICD-10-CM

## 2019-05-12 DIAGNOSIS — R1084 Generalized abdominal pain: Secondary | ICD-10-CM

## 2019-05-12 LAB — CBC WITH DIFFERENTIAL/PLATELET
Basophils Absolute: 0.1 10*3/uL (ref 0.0–0.1)
Basophils Relative: 0.8 % (ref 0.0–3.0)
Eosinophils Absolute: 0.1 10*3/uL (ref 0.0–0.7)
Eosinophils Relative: 1.3 % (ref 0.0–5.0)
HCT: 38.1 % — ABNORMAL LOW (ref 39.0–52.0)
Hemoglobin: 13 g/dL (ref 13.0–17.0)
Lymphocytes Relative: 28.2 % (ref 12.0–46.0)
Lymphs Abs: 2 10*3/uL (ref 0.7–4.0)
MCHC: 34 g/dL (ref 30.0–36.0)
MCV: 88 fl (ref 78.0–100.0)
Monocytes Absolute: 0.7 10*3/uL (ref 0.1–1.0)
Monocytes Relative: 9.5 % (ref 3.0–12.0)
Neutro Abs: 4.2 10*3/uL (ref 1.4–7.7)
Neutrophils Relative %: 60.2 % (ref 43.0–77.0)
Platelets: 290 10*3/uL (ref 150.0–400.0)
RBC: 4.33 Mil/uL (ref 4.22–5.81)
RDW: 13.4 % (ref 11.5–15.5)
WBC: 7 10*3/uL (ref 4.0–10.5)

## 2020-02-03 IMAGING — DX DG CHEST 2V
2 series · 2 of 2 positions shown · non-contrast
Comparison: March 20, 2013

CLINICAL DATA: Recurring right-sided chest pain with activity.

EXAM:
CHEST - 2 VIEW

[chest pa]
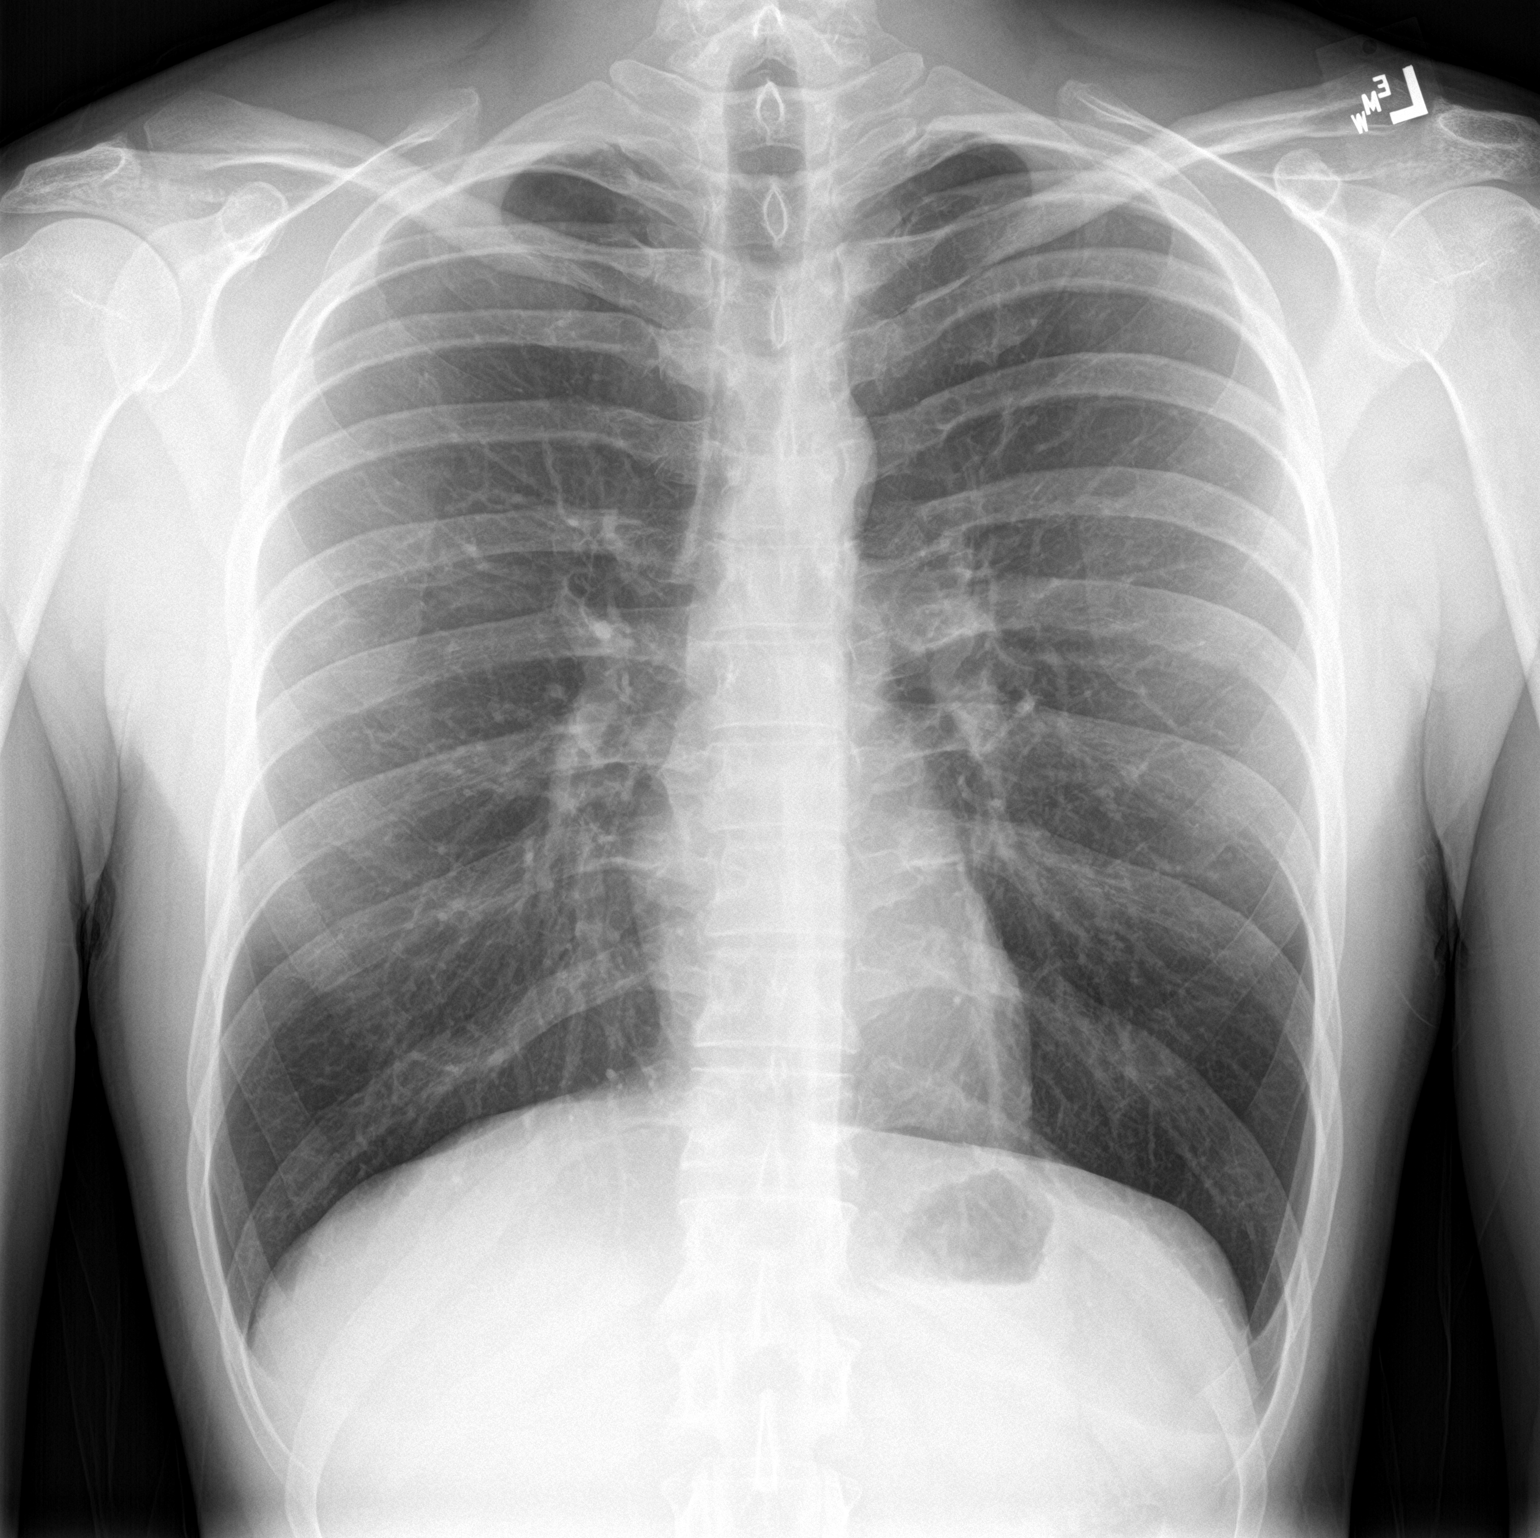

[chest lat]
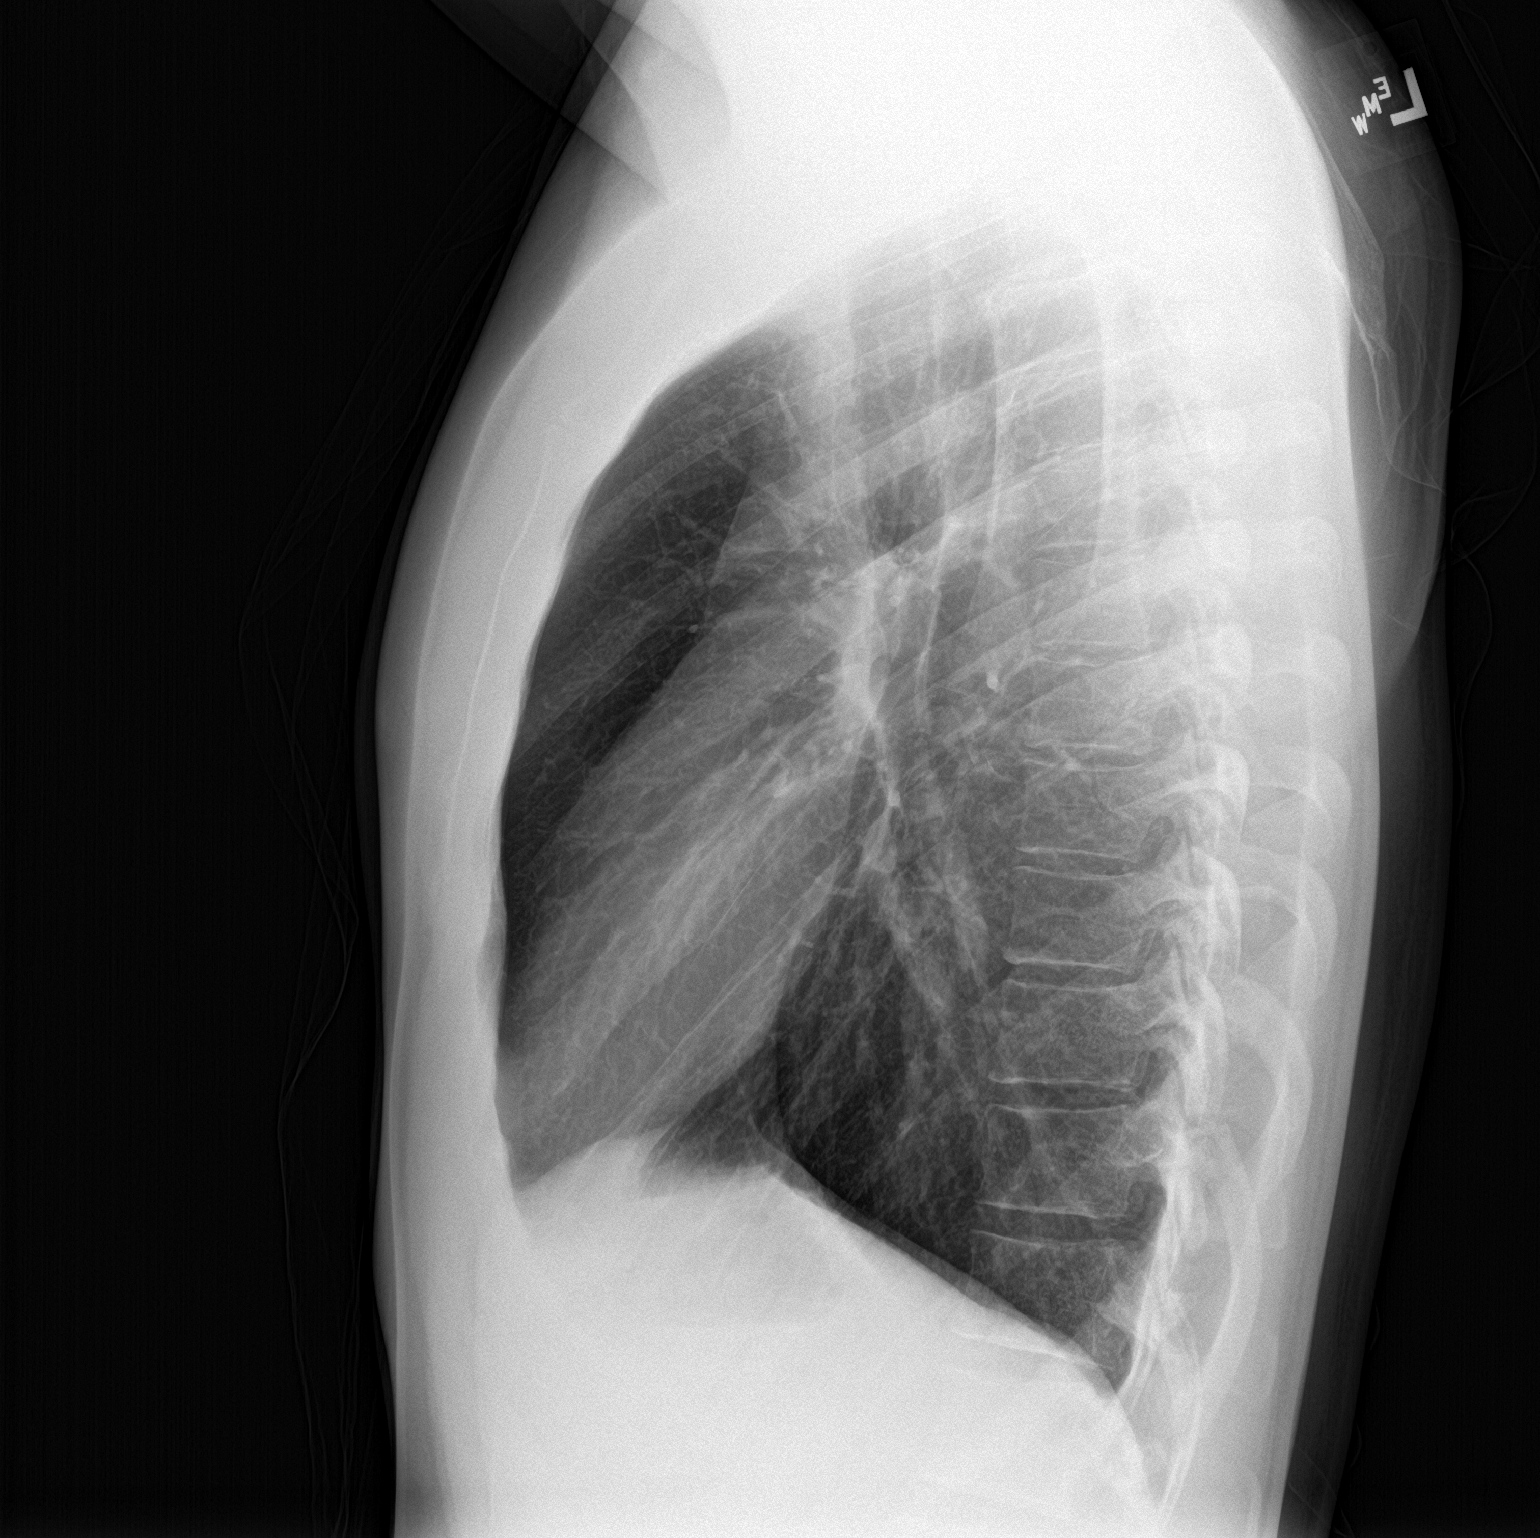

[2 of 2 positions shown; findings below may reference images not displayed]

FINDINGS: The heart size and mediastinal contours are within normal limits.
Both lungs are clear. The visualized skeletal structures are
unremarkable.
IMPRESSION: No active cardiopulmonary disease.

## 2020-02-09 DIAGNOSIS — R197 Diarrhea, unspecified: Secondary | ICD-10-CM | POA: Diagnosis not present

## 2020-09-14 DIAGNOSIS — Z20822 Contact with and (suspected) exposure to covid-19: Secondary | ICD-10-CM | POA: Diagnosis not present

## 2021-06-27 ENCOUNTER — Ambulatory Visit: Payer: BC Managed Care – PPO | Admitting: Medical-Surgical

## 2021-06-27 ENCOUNTER — Encounter: Payer: Self-pay | Admitting: Medical-Surgical

## 2021-06-27 VITALS — BP 116/74 | HR 71 | Resp 20 | Ht 70.0 in | Wt 168.9 lb

## 2021-06-27 DIAGNOSIS — Z7689 Persons encountering health services in other specified circumstances: Secondary | ICD-10-CM | POA: Diagnosis not present

## 2021-06-27 DIAGNOSIS — Z0000 Encounter for general adult medical examination without abnormal findings: Secondary | ICD-10-CM

## 2021-06-27 NOTE — Progress Notes (Signed)
? ?New Patient Office Visit ? ?Subjective:  ?Patient ID: Raymond Larson, male    DOB: 1985/06/04  Age: 36 y.o. MRN: [id number] ? ?CC:  ?Chief Complaint  ?Patient presents with  ? Annual Exam  ? Establish Care  ? ?HPI ?Raymond Larson presents to establish care.  He is a very pleasant 36 year old male who is married with 2 small children age 68 years and 4 months.  He currently works as a Data processing manager for [employer]. ? ?Dentist-overdue, no current concerns ?Eye exam-overdue, no correction, no concerns ?Exercise-none intentional, used to exercise regularly until his children were born ?Diet-no restrictions ?COVID vaccines-1 J&J vaccine, no boosters ? ?Past Medical History:  ?Diagnosis Date  ? ABDOMINAL PAIN   ? ASTHMA   ? LOW BACK PAIN   ? ?History reviewed. No pertinent surgical history. ? ?Family History  ?Problem Relation Age of Onset  ? Colon cancer Father   ?     had to have colon removed per patient dx either 64 or 63  ? Esophageal cancer Neg Hx   ? ?Social History  ? ?Socioeconomic History  ? Marital status: Married  ?  Spouse name: Not on file  ? Number of children: Not on file  ? Years of education: Not on file  ? Highest education level: Not on file  ?Occupational History  ? Not on file  ?Tobacco Use  ? Smoking status: Never  ? Smokeless tobacco: Never  ? Tobacco comments:  ?  dip in high school  ?Vaping Use  ? Vaping Use: Never used  ?Substance and Sexual Activity  ? Alcohol use: Yes  ?  Comment: ocassionally   ? Drug use: Never  ? Sexual activity: Yes  ?  Birth control/protection: Condom  ?  Comment: Married  ?Other Topics Concern  ? Not on file  ?Social History Narrative  ? Not on file  ? ?Social Determinants of Health  ? ?Financial Resource Strain: Not on file  ?Food Insecurity: Not on file  ?Transportation Needs: Not on file  ?Physical Activity: Not on file  ?Stress: Not on file  ?Social Connections: Not on file  ?Intimate Partner Violence: Not on file  ? ? ?ROS ?Review of Systems   ?Constitutional:  Negative for chills, fatigue, fever and unexpected weight change.  ?HENT:  Negative for congestion, rhinorrhea, sinus pressure and sore throat.   ?Eyes:  Negative for visual disturbance.  ?Respiratory:  Negative for cough, chest tightness, shortness of breath and wheezing.   ?Cardiovascular:  Negative for chest pain, palpitations and leg swelling.  ?Gastrointestinal:  Positive for anal bleeding (Small amount of bright red blood on toilet tissue intermittently). Negative for abdominal pain, constipation, diarrhea, nausea and vomiting.  ?Genitourinary:  Negative for dysuria, frequency and urgency.  ?Skin:  Negative for rash.  ?Neurological:  Negative for dizziness, light-headedness and headaches.  ?Psychiatric/Behavioral:  Negative for dysphoric mood, self-injury, sleep disturbance and suicidal ideas. The patient is not nervous/anxious.   ? ?Objective:  ? ?Today's Vitals: BP 116/74 (BP Location: Left Arm, Cuff Size: Large)   Pulse 71   Resp 20   Ht 5\' 10"  (1.778 m)   Wt 168 lb 14.4 oz (76.6 kg)   SpO2 98%   BMI 24.23 kg/m?  ? ?Physical Exam ?Constitutional:   ?   General: He is not in acute distress. ?   Appearance: Normal appearance. He is not ill-appearing.  ?HENT:  ?   Head: Normocephalic and atraumatic.  ?  Right Ear: Tympanic membrane normal.  ?   Left Ear: Tympanic membrane normal.  ?   Nose: Nose normal.  ?   Mouth/Throat:  ?   Mouth: Mucous membranes are moist.  ?   Pharynx: No oropharyngeal exudate or posterior oropharyngeal erythema.  ?Eyes:  ?   Extraocular Movements: Extraocular movements intact.  ?   Conjunctiva/sclera: Conjunctivae normal.  ?   Pupils: Pupils are equal, round, and reactive to light.  ?Neck:  ?   Thyroid: No thyromegaly.  ?   Vascular: No carotid bruit or JVD.  ?   Trachea: Trachea normal.  ?Cardiovascular:  ?   Rate and Rhythm: Normal rate and regular rhythm.  ?   Pulses: Normal pulses.  ?   Heart sounds: Normal heart sounds. No murmur heard. ?  No friction rub.  No gallop.  ?Pulmonary:  ?   Effort: Pulmonary effort is normal. No respiratory distress.  ?   Breath sounds: Normal breath sounds. No wheezing.  ?Abdominal:  ?   General: Bowel sounds are normal. There is no distension.  ?   Palpations: Abdomen is soft.  ?   Tenderness: There is no abdominal tenderness. There is no guarding.  ?Musculoskeletal:     ?   General: Normal range of motion.  ?   Cervical back: Normal range of motion and neck supple.  ?Skin: ?   General: Skin is warm and dry.  ?Neurological:  ?   Mental Status: He is alert and oriented to person, place, and time.  ?   Cranial Nerves: No cranial nerve deficit.  ?Psychiatric:     ?   Mood and Affect: Mood normal.     ?   Behavior: Behavior normal.     ?   Thought Content: Thought content normal.     ?   Judgment: Judgment normal.  ? ? ?Assessment & Plan:  ? ?1. Encounter to establish care ?Reviewed available information and discussed care concerns with patient.  ? ?2. Annual physical exam ?Checking labs as below.  Overdue for preventative care for dental as well as eye care.  Recommend getting these updated at his earliest convenience.  Wellness information provided with AVS. ?- CBC with Differential/Platelet ?- COMPLETE METABOLIC PANEL WITH GFR ?- Lipid panel ? ? ?Outpatient Encounter Medications as of 06/27/2021  ?Medication Sig  ? [DISCONTINUED] Ascorbic Acid (VITAMIN C PO) Take 1 tablet by mouth as needed. (Patient not taking: Reported on 06/27/2021)  ? [DISCONTINUED] dicyclomine (BENTYL) 20 MG tablet Take 1 tablet (20 mg total) by mouth 4 (four) times daily as needed (abdominal pain). (Patient not taking: Reported on 05/05/2019)  ? [DISCONTINUED] Multiple Vitamins-Minerals (ZINC PO) Take 1 tablet by mouth as needed.  ? ?No facility-administered encounter medications on file as of 06/27/2021.  ? ? ?Follow-up: Return in about 1 year (around 06/28/2022) for annual physical exam.  ? ?Thayer Ohm, DNP, APRN, FNP-BC ?[facility] ?Primary Care  and Sports Medicine ? ?

## 2021-06-28 LAB — CBC WITH DIFFERENTIAL/PLATELET
Absolute Monocytes: 564 cells/uL (ref 200–950)
Basophils Absolute: 50 cells/uL (ref 0–200)
Basophils Relative: 0.8 %
Eosinophils Absolute: 99 cells/uL (ref 15–500)
Eosinophils Relative: 1.6 %
HCT: 41.6 % (ref 38.5–50.0)
Hemoglobin: 14 g/dL (ref 13.2–17.1)
Lymphs Abs: 1761 cells/uL (ref 850–3900)
MCH: 29.5 pg (ref 27.0–33.0)
MCHC: 33.7 g/dL (ref 32.0–36.0)
MCV: 87.6 fL (ref 80.0–100.0)
MPV: 10.7 fL (ref 7.5–12.5)
Monocytes Relative: 9.1 %
Neutro Abs: 3726 cells/uL (ref 1500–7800)
Neutrophils Relative %: 60.1 %
Platelets: 277 10*3/uL (ref 140–400)
RBC: 4.75 10*6/uL (ref 4.20–5.80)
RDW: 12.2 % (ref 11.0–15.0)
Total Lymphocyte: 28.4 %
WBC: 6.2 10*3/uL (ref 3.8–10.8)

## 2021-06-28 LAB — LIPID PANEL
Cholesterol: 186 mg/dL (ref <200)
HDL: 53 mg/dL (ref >=40)
LDL Cholesterol (Calc): 121 mg/dL (calc) — ABNORMAL HIGH
Non-HDL Cholesterol (Calc): 133 mg/dL (calc) — ABNORMAL HIGH (ref <130)
Total CHOL/HDL Ratio: 3.5 (calc) (ref <5.0)
Triglycerides: 40 mg/dL (ref <150)

## 2021-06-28 LAB — COMPLETE METABOLIC PANEL WITH GFR
AG Ratio: 1.7 (calc) (ref 1.0–2.5)
ALT: 10 U/L (ref 9–46)
AST: 13 U/L (ref 10–40)
Albumin: 4.6 g/dL (ref 3.6–5.1)
Alkaline phosphatase (APISO): 37 U/L (ref 36–130)
BUN: 15 mg/dL (ref 7–25)
CO2: 27 mmol/L (ref 20–32)
Calcium: 9.6 mg/dL (ref 8.6–10.3)
Chloride: 104 mmol/L (ref 98–110)
Creat: 1.05 mg/dL (ref 0.60–1.26)
Globulin: 2.7 g/dL (calc) (ref 1.9–3.7)
Glucose, Bld: 78 mg/dL (ref 65–99)
Potassium: 4.3 mmol/L (ref 3.5–5.3)
Sodium: 141 mmol/L (ref 135–146)
Total Bilirubin: 0.7 mg/dL (ref 0.2–1.2)
Total Protein: 7.3 g/dL (ref 6.1–8.1)
eGFR: 95 mL/min/{1.73_m2} (ref >=60)

## 2021-06-28 LAB — SPECIMEN COMPROMISED

## 2021-12-11 ENCOUNTER — Encounter: Payer: Self-pay | Admitting: Emergency Medicine

## 2021-12-11 ENCOUNTER — Ambulatory Visit
Admission: EM | Admit: 2021-12-11 | Discharge: 2021-12-11 | Disposition: A | Discharge status: Home / Self Care | Payer: BC Managed Care – PPO | Attending: Family Medicine | Admitting: Family Medicine

## 2021-12-11 DIAGNOSIS — H109 Unspecified conjunctivitis: Secondary | ICD-10-CM | POA: Diagnosis not present

## 2021-12-11 MED ORDER — SULFACETAMIDE SODIUM 10 % OP SOLN: 1.0000 [drp] | Freq: Three times a day (TID) | OPHTHALMIC | 0 refills | Status: AC

## 2021-12-11 NOTE — ED Triage Notes (Signed)
Patient c/o right eye redness, matted together this morning when he got up, some drainage from right eye.  No apparent injury.  Denies any glasses or contacts.

## 2021-12-11 NOTE — ED Provider Notes (Signed)
[facility]    CSN: [id number] Arrival date & time: 12/11/21  1819      History   Chief Complaint Chief Complaint  Patient presents with   Eye Problem    HPI Raymond Larson is a 36 y.o. male.   HPI 36 year old male presents with right eye redness for 1 day reports matted shut earlier this morning.  Patient has small children at home and is concerned with conjunctivitis.  Past Medical History:  Diagnosis Date   ABDOMINAL PAIN    ASTHMA    LOW BACK PAIN     There are no problems to display for this patient.   History reviewed. No pertinent surgical history.     Home Medications    Prior to Admission medications   Medication Sig Start Date End Date Taking? Authorizing Provider  sulfacetamide (BLEPH-10) 10 % ophthalmic solution Place 1-2 drops into the right eye in the morning, at noon, and at bedtime for 5 days. 12/11/21 12/16/21 Yes Eliezer Lofts, FNP    Family History Family History  Problem Relation Age of Onset   Colon cancer Father        had to have colon removed per patient dx either 69 or 64   Esophageal cancer Neg Hx     Social History Social History   Tobacco Use   Smoking status: Never   Smokeless tobacco: Never   Tobacco comments:    dip in high school  Vaping Use   Vaping Use: Never used  Substance Use Topics   Alcohol use: Yes    Comment: ocassionally    Drug use: Never     Allergies   Patient has no known allergies.   Review of Systems Review of Systems  Eyes:  Positive for discharge and redness.  All other systems reviewed and are negative.    Physical Exam Triage Vital Signs ED Triage Vitals  Enc Vitals Group     BP      Pulse      Resp      Temp      Temp src      SpO2      Weight      Height      Head Circumference      Peak Flow      Pain Score      Pain Loc      Pain Edu?      Excl. in [place]?    No data found.  Updated Vital Signs BP 129/80   Pulse 74   Temp 98 F (36.7 C) (Oral)    Resp 18   Ht 5\' 10"  (1.778 m)   Wt 170 lb (77.1 kg)   SpO2 98%   BMI 24.39 kg/m    Physical Exam Vitals and nursing note reviewed.  Constitutional:      General: He is not in acute distress.    Appearance: Normal appearance. He is normal weight. He is not ill-appearing.  HENT:     Head: Normocephalic and atraumatic.     Nose: Nose normal.     Mouth/Throat:     Mouth: Mucous membranes are moist.     Pharynx: Oropharynx is clear.  Eyes:     Extraocular Movements: Extraocular movements intact.     Conjunctiva/sclera: Conjunctivae normal.     Pupils: Pupils are equal, round, and reactive to light.     Comments: Right eye: Sclera with +2 injection, crusted mucopurulent discharge noted at inner canthus  Cardiovascular:     Rate and Rhythm: Normal rate and regular rhythm.     Pulses: Normal pulses.     Heart sounds: Normal heart sounds.  Pulmonary:     Effort: Pulmonary effort is normal.     Breath sounds: Normal breath sounds. No wheezing, rhonchi or rales.  Musculoskeletal:        General: Normal range of motion.     Cervical back: Normal range of motion and neck supple.  Skin:    General: Skin is warm and dry.  Neurological:     General: No focal deficit present.     Mental Status: He is alert and oriented to person, place, and time. Mental status is at baseline.      UC Treatments / Results  Labs (all labs ordered are listed, but only abnormal results are displayed) Labs Reviewed - No data to display  EKG   Radiology No results found.  Procedures Procedures (including critical care time)  Medications Ordered in UC Medications - No data to display  Initial Impression / Assessment and Plan / UC Course  I have reviewed the triage vital signs and the nursing notes.  Pertinent labs & imaging results that were available during my care of the patient were reviewed by me and considered in my medical decision making (see chart for details).     MDM: 1.   Conjunctivitis of right eye, unspecified conjunctivitis type-Rx'd Sulfacetamide. Advised patient to instill eyedrops as directed.  Advised if left eye becomes symptomatic may treat left eye with sulfacetamide as well.  Advised patient if symptoms worsen and/or unresolved please follow-up with local ophthalmology/optometry for further evaluation.  Patient discharged home, hemodynamically stable. Final Clinical Impressions(s) / UC Diagnoses   Final diagnoses:  Conjunctivitis of right eye, unspecified conjunctivitis type     Discharge Instructions      Advised patient to instill eyedrops as directed.  Advised if left eye becomes symptomatic may treat left eye with sulfacetamide as well.  Advised patient if symptoms worsen and/or unresolved please follow-up with local ophthalmology/optometry for further evaluation.     ED Prescriptions     Medication Sig Dispense Auth. Provider   sulfacetamide (BLEPH-10) 10 % ophthalmic solution Place 1-2 drops into the right eye in the morning, at noon, and at bedtime for 5 days. 5 mL Trevor Iha, FNP      PDMP not reviewed this encounter.   Trevor Iha, FNP 12/11/21 1921

## 2021-12-11 NOTE — Discharge Instructions (Addendum)
Advised patient to instill eyedrops as directed.  Advised if left eye becomes symptomatic may treat left eye with sulfacetamide as well.  Advised patient if symptoms worsen and/or unresolved please follow-up with local ophthalmology/optometry for further evaluation.

## 2021-12-29 DIAGNOSIS — R051 Acute cough: Secondary | ICD-10-CM | POA: Diagnosis not present

## 2022-06-06 DIAGNOSIS — L03019 Cellulitis of unspecified finger: Secondary | ICD-10-CM | POA: Diagnosis not present

## 2022-06-06 DIAGNOSIS — L03818 Cellulitis of other sites: Secondary | ICD-10-CM | POA: Diagnosis not present
